# Patient Record
Sex: Male | Born: 1990 | Race: White | Hispanic: No | Marital: Single | Smoking: Current every day smoker
Health system: Southern US, Community
[De-identification: ages and names within clinical notes are randomized; demographics above are authoritative.]

## PROBLEM LIST (undated history)

## (undated) DIAGNOSIS — F32A Depression, unspecified: Secondary | ICD-10-CM

## (undated) DIAGNOSIS — F419 Anxiety disorder, unspecified: Secondary | ICD-10-CM

## (undated) DIAGNOSIS — F329 Major depressive disorder, single episode, unspecified: Secondary | ICD-10-CM

---

## 2017-08-24 DIAGNOSIS — N179 Acute kidney failure, unspecified: Secondary | ICD-10-CM | POA: Diagnosis not present

## 2017-08-24 DIAGNOSIS — E669 Obesity, unspecified: Secondary | ICD-10-CM

## 2017-08-24 DIAGNOSIS — T50902A Poisoning by unspecified drugs, medicaments and biological substances, intentional self-harm, initial encounter: Secondary | ICD-10-CM | POA: Diagnosis not present

## 2017-08-24 DIAGNOSIS — D72829 Elevated white blood cell count, unspecified: Secondary | ICD-10-CM

## 2017-08-25 ENCOUNTER — Other Ambulatory Visit: Payer: Self-pay

## 2017-08-25 ENCOUNTER — Encounter (HOSPITAL_COMMUNITY): Payer: Self-pay | Admitting: *Deleted

## 2017-08-25 ENCOUNTER — Inpatient Hospital Stay (HOSPITAL_COMMUNITY)
Admission: AD | Admit: 2017-08-25 | Discharge: 2017-08-30 | DRG: 885 | Disposition: A | Discharge status: Home / Self Care | Payer: BLUE CROSS/BLUE SHIELD | Source: Other Acute Inpatient Hospital | Attending: Psychiatry | Admitting: Psychiatry

## 2017-08-25 DIAGNOSIS — F909 Attention-deficit hyperactivity disorder, unspecified type: Secondary | ICD-10-CM | POA: Diagnosis present

## 2017-08-25 DIAGNOSIS — F17290 Nicotine dependence, other tobacco product, uncomplicated: Secondary | ICD-10-CM | POA: Diagnosis present

## 2017-08-25 DIAGNOSIS — R450 Nervousness: Secondary | ICD-10-CM | POA: Diagnosis not present

## 2017-08-25 DIAGNOSIS — Z880 Allergy status to penicillin: Secondary | ICD-10-CM

## 2017-08-25 DIAGNOSIS — N179 Acute kidney failure, unspecified: Secondary | ICD-10-CM | POA: Diagnosis not present

## 2017-08-25 DIAGNOSIS — F332 Major depressive disorder, recurrent severe without psychotic features: Secondary | ICD-10-CM | POA: Diagnosis present

## 2017-08-25 DIAGNOSIS — R45851 Suicidal ideations: Secondary | ICD-10-CM | POA: Diagnosis not present

## 2017-08-25 DIAGNOSIS — D72829 Elevated white blood cell count, unspecified: Secondary | ICD-10-CM | POA: Diagnosis not present

## 2017-08-25 DIAGNOSIS — F17210 Nicotine dependence, cigarettes, uncomplicated: Secondary | ICD-10-CM | POA: Diagnosis not present

## 2017-08-25 DIAGNOSIS — E669 Obesity, unspecified: Secondary | ICD-10-CM | POA: Diagnosis not present

## 2017-08-25 DIAGNOSIS — F419 Anxiety disorder, unspecified: Secondary | ICD-10-CM | POA: Diagnosis present

## 2017-08-25 DIAGNOSIS — F603 Borderline personality disorder: Secondary | ICD-10-CM | POA: Diagnosis present

## 2017-08-25 DIAGNOSIS — Z79899 Other long term (current) drug therapy: Secondary | ICD-10-CM | POA: Diagnosis not present

## 2017-08-25 DIAGNOSIS — F322 Major depressive disorder, single episode, severe without psychotic features: Secondary | ICD-10-CM | POA: Diagnosis not present

## 2017-08-25 DIAGNOSIS — F1010 Alcohol abuse, uncomplicated: Secondary | ICD-10-CM | POA: Diagnosis present

## 2017-08-25 DIAGNOSIS — T43222A Poisoning by selective serotonin reuptake inhibitors, intentional self-harm, initial encounter: Secondary | ICD-10-CM | POA: Diagnosis present

## 2017-08-25 DIAGNOSIS — Z915 Personal history of self-harm: Secondary | ICD-10-CM | POA: Diagnosis not present

## 2017-08-25 DIAGNOSIS — T39312A Poisoning by propionic acid derivatives, intentional self-harm, initial encounter: Secondary | ICD-10-CM | POA: Diagnosis present

## 2017-08-25 DIAGNOSIS — G479 Sleep disorder, unspecified: Secondary | ICD-10-CM | POA: Diagnosis present

## 2017-08-25 DIAGNOSIS — T50902A Poisoning by unspecified drugs, medicaments and biological substances, intentional self-harm, initial encounter: Secondary | ICD-10-CM | POA: Diagnosis not present

## 2017-08-25 DIAGNOSIS — F14929 Cocaine use, unspecified with intoxication, unspecified: Secondary | ICD-10-CM | POA: Diagnosis present

## 2017-08-25 DIAGNOSIS — Z91018 Allergy to other foods: Secondary | ICD-10-CM | POA: Diagnosis not present

## 2017-08-25 HISTORY — DX: Depression, unspecified: F32.A

## 2017-08-25 HISTORY — DX: Major depressive disorder, single episode, unspecified: F32.9

## 2017-08-25 HISTORY — DX: Anxiety disorder, unspecified: F41.9

## 2017-08-25 MED ORDER — HYDROXYZINE HCL 25 MG PO TABS
25.0000 mg | ORAL_TABLET | Freq: Three times a day (TID) | ORAL | Status: DC | PRN
  Administered 2017-08-26 – 2017-08-27 (×2): 25 mg via ORAL
  Filled 2017-08-25 (×2): qty 1

## 2017-08-25 MED ORDER — NICOTINE 14 MG/24HR TD PT24
14.0000 mg | MEDICATED_PATCH | Freq: Every day | TRANSDERMAL | Status: DC
  Administered 2017-08-26 – 2017-08-30 (×5): 14 mg via TRANSDERMAL
  Filled 2017-08-25 (×7): qty 1

## 2017-08-25 MED ORDER — TRAZODONE HCL 50 MG PO TABS
50.0000 mg | ORAL_TABLET | Freq: Every evening | ORAL | Status: DC | PRN
  Administered 2017-08-25: 50 mg via ORAL
  Filled 2017-08-25 (×6): qty 1

## 2017-08-25 NOTE — BH Assessment (Signed)
Assessment Note  Terrence Harris is an 27 y.o. male.  Per [facility] Sal[facility] assessment: "Pt overdosed on Ibuprofen XL 300mg  10 tabs and Lexapro 20mg  8 tabs. Pt admits to an intentional overdose. Pt states he wanted to feel numb. Pt currently denies SI/HI and AVH. Pt denies previous SI attempt. Pt denies current mental health history. Pt states he is prescribed Gabbapenten, Lexapro, and Wellbutrin. Pt states his PCP prescribed his psychiatric medication. Pt states that the last time he had mental health treatment was in November 2018. Pt states he was being treated for Borderline Personality Disorder, ADHD, depression, and anxiety. Pt states he is depressed due to recently going to through a divorce and his children are in DSS custody. Pt states he previously hospitalized at [facility] in 2018. Pt currently on probation for assault."  Diagnosis:  F33.2 MDD; F14.20 cocaine use  Past Medical History: No past medical history on file.   Family History: No family history on file.  Social History:  has no tobacco, alcohol, and drug history on file.  Additional Social History:  Alcohol / Drug Use Pain Medications: please see mar Prescriptions: please see mar Over the Counter: please see mar History of alcohol / drug use?: Yes Longest period of sobriety (when/how long): unknown Substance #1 Name of Substance 1: cocaine 1 - Age of First Use: unknown 1 - Amount (size/oz): unknown 1 - Frequency: unknown 1 - Duration: unknown 1 - Last Use / Amount: unknown  CIWA:   COWS:    Allergies: Allergies not on file  Home Medications:  No medications prior to admission.    OB/GYN Status:  No LMP for male patient.  General Assessment Data [facility] TTS Assessment: Out of system Is this a Tele or Face-to-Face Assessment?: Tele Assessment Is this an Initial Assessment or a Re-assessment for this encounter?: Initial Assessment Marital status: Divorced [place] name:  NA Is patient pregnant?: No Pregnancy Status: No Living Arrangements: Other relatives Can pt return to current living arrangement?: Yes Admission Status: Voluntary Is patient capable of signing voluntary admission?: Yes Referral Source: Self/Family/Friend Insurance type: BCBS     Crisis Care Plan Living Arrangements: Other relatives Legal Guardian: Other:(self) Name of Psychiatrist: NA Name of Therapist: NA  Education Status Is patient currently in school?: No Is the patient employed, unemployed or receiving disability?: Employed  Risk to self with the past 6 months Suicidal Ideation: Yes-Currently Present Has patient been a risk to self within the past 6 months prior to admission? : No Suicidal Intent: Yes-Currently Present Has patient had any suicidal intent within the past 6 months prior to admission? : No Is patient at risk for suicide?: Yes Suicidal Plan?: Yes-Currently Present Has patient had any suicidal plan within the past 6 months prior to admission? : No Specify Current Suicidal Plan: to overdose Access to Means: Yes Specify Access to Suicidal Means: access to pills What has been your use of drugs/alcohol within the last 12 months?: cocaine Previous Attempts/Gestures: No How many times?: 0 Other Self Harm Risks: NA Triggers for Past Attempts: None known Intentional Self Injurious Behavior: None Family Suicide History: No Recent stressful life event(s): Conflict (Comment), Divorce Persecutory voices/beliefs?: No Depression: Yes Depression Symptoms: Insomnia, Isolating, Tearfulness, Loss of interest in usual pleasures, Feeling worthless/self pity, Feeling angry/irritable Substance abuse history and/or treatment for substance abuse?: No Suicide prevention information given to non-admitted patients: Not applicable  Risk to Others within the past 6 months Homicidal Ideation: No Does patient have  any lifetime risk of violence toward others beyond the six months  prior to admission? : No Thoughts of Harm to Others: No Current Homicidal Intent: No Current Homicidal Plan: No Access to Homicidal Means: No Identified Victim: NA History of harm to others?: No Assessment of Violence: None Noted Violent Behavior Description: NA Does patient have access to weapons?: No Criminal Charges Pending?: No Does patient have a court date: Yes Court Date: 08/25/17(DSS custody case) Is patient on probation?: No  Psychosis Hallucinations: None noted Delusions: None noted  Mental Status Report Appearance/Hygiene: Unremarkable Eye Contact: Fair Motor Activity: Freedom of movement Speech: Logical/coherent Level of Consciousness: Alert Mood: Depressed Affect: Depressed Anxiety Level: Minimal Thought Processes: Coherent, Relevant Judgement: Impaired Orientation: Person, Place, Time, Situation, Appropriate for developmental age Obsessive Compulsive Thoughts/Behaviors: None  Cognitive Functioning Concentration: Normal Memory: Recent Intact, Remote Intact Is patient IDD: No Is patient DD?: No Insight: Poor Impulse Control: Poor Appetite: Fair Have you had any weight changes? : No Change Sleep: Decreased Total Hours of Sleep: 5 Vegetative Symptoms: None  ADLScreening [facility]) Patient's cognitive ability adequate to safely complete daily activities?: Yes Patient able to express need for assistance with ADLs?: Yes Independently performs ADLs?: Yes (appropriate for developmental age)  Prior Inpatient Therapy Prior Inpatient Therapy: No  Prior Outpatient Therapy Prior Outpatient Therapy: No Does patient have an ACCT team?: No Does patient have Intensive In-House Services?  : No Does patient have [facility] services? : No Does patient have P4CC services?: No  ADL Screening (condition at time of admission) Patient's cognitive ability adequate to safely complete daily activities?: Yes Is the patient deaf or have difficulty hearing?:  No Does the patient have difficulty seeing, even when wearing glasses/contacts?: No Does the patient have difficulty concentrating, remembering, or making decisions?: No Patient able to express need for assistance with ADLs?: Yes Does the patient have difficulty dressing or bathing?: No Independently performs ADLs?: Yes (appropriate for developmental age) Does the patient have difficulty walking or climbing stairs?: No       Abuse/Neglect Assessment (Assessment to be complete while patient is alone) Abuse/Neglect Assessment Can Be Completed: Yes Physical Abuse: Denies Verbal Abuse: Denies Sexual Abuse: Denies Exploitation of patient/patient's resources: Denies     Merchant navy officerAdvance Directives (For Healthcare) Does Patient Have a Medical Advance Directive?: No Would patient like information on creating a medical advance directive?: No - Patient declined    Additional Information 1:1 In Past 12 Months?: No CIRT Risk: No Elopement Risk: No Does patient have medical clearance?: Yes     Disposition:  Disposition Initial Assessment Completed for this Encounter: Yes Disposition of Patient: Admit Type of inpatient treatment program: Adult  On Site Evaluation by:   Reviewed with Physician:    Emmit PomfretLevette,Bradey Luzier D 08/25/2017 4:51 PM

## 2017-08-25 NOTE — Progress Notes (Signed)
Terrence Harris is a 27 year old male pt [facility] on [facility] basis [facility] On admission he spoke about how he took too many of his medications but denied it was a suicide attempt and reports he was trying to get some sleep. He reports that his roommate called the police because he was acting weird and was subsequently [facility]. He denies any SI and is able to contract for safety on the unit. He reports having some stressors including his children being in foster care and reports that both he and his ex-wife have issues and believe that he will ultimately lose custody of them. He also reports having difficulties, in general, since he got divorced. He reports that he has a PCP that prescribes his medications and reports that he takes his medications as prescribed. He denies any substance abuse issues however UDS was positive for cocaine. He reports that he lives with a roommate and reports that he will return there once he is discharged. [name] was oriented to the unit and safety maintained.

## 2017-08-25 NOTE — Tx Team (Signed)
Initial Treatment Plan 08/25/2017 7:47 PM Terrence Fishmanichard [place] ZOX:[id number]    PATIENT STRESSORS: Marital or family conflict Substance abuse   PATIENT STRENGTHS: Ability for insight Average or above average intelligence Capable of independent living General fund of knowledge   PATIENT IDENTIFIED PROBLEMS: Depression Anxiety Suicidal thoughts "I have been having a rough time since my divorce" "I'm probably going to lose my kids, they are in foster care right now"                     DISCHARGE CRITERIA:  Ability to meet basic life and health needs Improved stabilization in mood, thinking, and/or behavior Reduction of life-threatening or endangering symptoms to within safe limits Verbal commitment to aftercare and medication compliance  PRELIMINARY DISCHARGE PLAN: Attend aftercare/continuing care group Return to previous living arrangement  PATIENT/FAMILY INVOLVEMENT: This treatment plan has been presented to and reviewed with the patient, Terrence Harris, and/or family member, .  The patient and family have been given the opportunity to ask questions and make suggestions.  Constantina Laseter, Terrence Harris, [place]RN 08/25/2017, 7:47 PM

## 2017-08-26 ENCOUNTER — Encounter (HOSPITAL_COMMUNITY): Payer: Self-pay | Admitting: Behavioral Health

## 2017-08-26 DIAGNOSIS — R45851 Suicidal ideations: Secondary | ICD-10-CM

## 2017-08-26 DIAGNOSIS — R450 Nervousness: Secondary | ICD-10-CM

## 2017-08-26 DIAGNOSIS — F17210 Nicotine dependence, cigarettes, uncomplicated: Secondary | ICD-10-CM

## 2017-08-26 DIAGNOSIS — F332 Major depressive disorder, recurrent severe without psychotic features: Secondary | ICD-10-CM

## 2017-08-26 DIAGNOSIS — F322 Major depressive disorder, single episode, severe without psychotic features: Secondary | ICD-10-CM

## 2017-08-26 LAB — URINALYSIS, COMPLETE (UACMP) WITH MICROSCOPIC
Bacteria, UA: NONE SEEN
Bilirubin Urine: NEGATIVE
Glucose, UA: NEGATIVE mg/dL
Hgb urine dipstick: NEGATIVE
Ketones, ur: NEGATIVE mg/dL
Leukocytes, UA: NEGATIVE
Nitrite: NEGATIVE
Protein, ur: NEGATIVE mg/dL
Specific Gravity, Urine: 1.021 (ref 1.005–1.030)
pH: 5 (ref 5.0–8.0)

## 2017-08-26 LAB — RAPID URINE DRUG SCREEN, HOSP PERFORMED
Amphetamines: NOT DETECTED
Benzodiazepines: NOT DETECTED
Cocaine: NOT DETECTED
Opiates: NOT DETECTED
Tetrahydrocannabinol: NOT DETECTED

## 2017-08-26 MED ORDER — MIRTAZAPINE 7.5 MG PO TABS
7.5000 mg | ORAL_TABLET | Freq: Every day | ORAL | Status: DC
  Administered 2017-08-26 – 2017-08-27 (×2): 7.5 mg via ORAL
  Filled 2017-08-26 (×3): qty 1

## 2017-08-26 MED ORDER — VENLAFAXINE HCL ER 37.5 MG PO CP24
37.5000 mg | ORAL_CAPSULE | Freq: Every day | ORAL | Status: DC
  Administered 2017-08-27 – 2017-08-28 (×2): 37.5 mg via ORAL
  Filled 2017-08-26 (×3): qty 1
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
## 2017-08-26 NOTE — Progress Notes (Signed)
Patient ID: Terrence Harris, male   DOB: 03/19/1990, 27 y.o.   MRN: [id number]  D: Patient denies SI/HI and auditory and visual hallucinations. Patient is sad and depressed. Affect is flat. Brightens somewhat on approach. Attending and participating appropriately in groups.  A: Patient given emotional support from RN. Patient given medications per MD orders. Patient encouraged to attend groups and unit activities. Patient encouraged to come to staff with any questions or concerns.  R: Patient remains cooperative and appropriate. Will continue to monitor patient for safety.

## 2017-08-26 NOTE — Progress Notes (Signed)
Pt reports he has still had a high anxiety level today.  He states the doctor made med changes and he is hopeful that it will be effective to lower his anxiety.  He denies SI/HI/AVH at this time.  He was given Vistaril 25 mg prn at his request for his anxiety.  He was observed to have minimal interaction with other patients, but has been pleasant and appropriate.  Support and encouragement offered.  Discharge plans are in process.  Safety maintained with q15 minute checks.

## 2017-08-26 NOTE — [facility] Group Notes (Signed)
[facility] Group Notes:  (Nursing/MHT/Case Management/Adjunct)  Date:  08/26/2017  Time:  4:00 PM  Type of Therapy:  Psychoeducational Skills  Participation Level:  Did Not Attend  Summary of Progress/Problems: Patient was invited but declined to attend group.  Marchelle Folksmanda A Xzavier Swinger 08/26/2017, 5:00 PM

## 2017-08-26 NOTE — [facility] Suicide Risk Assessment (Signed)
[facility]   Nursing information obtained from:  Patient Demographic factors:  Male, Adolescent or young adult, Divorced or widowed, Caucasian Current Mental Status:  Suicidal ideation indicated by patient, Self-harm behaviors Loss Factors:  Loss of significant relationship Historical Factors:  Prior suicide attempts, Family history of mental illness or substance abuse Risk Reduction Factors:  Responsible for children under 26 years of age, Positive coping skills or problem solving skills  Total Time spent with patient: 45 minutes Principal Problem:  MDD, no psychotic features  Diagnosis:   Patient Active Problem List   Diagnosis Date Noted  . MDD (major depressive disorder), severe (H[facility]) [F32.2] 08/25/2017   Subjective Data:   Continued Clinical Symptoms:  Alcohol Use Disorder Identification Test Final Score (AUDIT): 3 The "Alcohol Use Disorders Identification Test", Guidelines for Use in Primary Care, Second Edition.  World Science writer[employer] [facility](WHO). Score between 0-7:  no or low risk or alcohol related problems. Score between 8-15:  moderate risk of alcohol related problems. Score between 16-19:  high risk of alcohol related problems. Score 20 or above:  warrants further diagnostic evaluation for alcohol dependence and treatment.   CLINICAL FACTORS:  27 year old divorced male, lives with roommate, has two children ( 6,4) who are currently in CPS custody,currently unemployed .  [facility],  following overdose on prescribed Wellbutrin XL ( about 7 tablets ), Lexapro  (about 7 tablets as well). States overdose was impulsive, unplanned and followed a stressful phone conversation with ex wife .States purpose of overdose was not suicidal but " to get some rest ". States that after overdose roommate was concerned and called 911. He does endorse worsening depression, with subjective feelings of hopelessness, neuro-vegetative symptoms  such as decreased appetite, decreased energy level, poor sleep, anhedonia,but denies any suicidal ideations prior to above. Denies psychotic symptoms other than brief hallucinations ( now resolved ) following overdose .  One prior psychiatric admission 2 years ago for alcohol abuse and suicidal ideations. No history of prior suicidal attempts, history of self cutting 2 years ago, denies history of mania. Prior to admission he was being prescribed Wellbutrin XL, Lexapro, Neurontin, Vyvanse .  Denies drug abuse, reports binge drinking, denies history of blackouts , denies history of WDL seizures, does report DUI 3 weeks ago.  Reports last alcohol consumption more than 1-2 weeks ago.   Denies medical illnesses . Allergic to PCN and Red Dye.  Dx- MDD, No Psychotic Features   Plan - Inpatient admission. We discussed options, agrees to new antidepressant medication trial.  Start Effexor XR 37.5 mgrs QDAY, start Remeron 7.5 mgrs QHS.      Musculoskeletal: Strength & Muscle Tone: within normal limits Gait & Station: normal Patient leans: N/A  Psychiatric Specialty Exam: Physical Exam  ROS mild headache, no chest pain, no shortness of breath, no vomiting , no fever.   Blood pressure 130/83, pulse 88, temperature 99.5 F (37.5 C), temperature source Oral, resp. rate 20, height 5\' 10"  (1.778 m), weight 118.8 kg (262 lb).Body mass index is 37.59 kg/m.  General Appearance: Fairly Groomed  Eye Contact:  Good  Speech:  Normal Rate  Volume:  Normal  Mood:  Depressed  Affect:  constricted, vaguely anxious  Thought Process:  Linear and Descriptions of Associations: Intact  Orientation:  Other:  fully alert and attentive   Thought Content:  no hallucinations, no delusions, not internally preoccupied   Suicidal Thoughts:  No denies any current  suicidal or self injurious ideations, contracts for safety on unit   Homicidal Thoughts:  No denies any homicidal or violent ideations  Memory:  recent  and remote grossly intact   Judgement:  Fair  Insight:  Fair  Psychomotor Activity:  slightly restless   Concentration:  Concentration: Good and Attention Span: Good  Recall:  Good  Fund of Knowledge:  Good  Language:  Good  Akathisia:  Negative  Handed:  Right  AIMS (if indicated):     Assets:  Desire for Improvement Resilience  ADL's:  Intact  Cognition:  WNL  Sleep:  Number of Hours: 6.25      COGNITIVE FEATURES THAT CONTRIBUTE TO RISK:  Closed-mindedness and Loss of executive function    SUICIDE RISK:   Moderate:  Frequent suicidal ideation with limited intensity, and duration, some specificity in terms of plans, no associated intent, good self-control, limited dysphoria/symptomatology, some risk factors present, and identifiable protective factors, including available and accessible social support.  PLAN OF CARE: Patient will be admitted to inpatient psychiatric unit for stabilization and safety. Will provide and encourage milieu participation. Provide medication management and maked adjustments as needed.  Will follow daily.    I certify that inpatient services furnished can reasonably be expected to improve the patient's condition.   Craige Cotta, MD 08/26/2017, 2:03 PM

## 2017-08-26 NOTE — Progress Notes (Signed)
Adult Psychoeducational Group Note  Date:  08/26/2017 Time:  9:41 AM  Group Topic/Focus:  Orientation:   The focus of this group is to educate the patient on the purpose and policies of crisis stabilization and provide a format to answer questions about their admission.  The group details unit policies and expectations of patients while admitted.  Participation Level:  Active  Participation Quality:  Appropriate  Affect:  Appropriate  Cognitive:  Alert  Insight: Appropriate  Engagement in Group:  Engaged  Modes of Intervention:  Discussion and Education  Additional Comments:    Pt participated in group. Pt's goal today is to find information regarding outpatient therapy that he can attend after he is discharged.   Karren CobbleFizah G Payslie Mccaig 08/26/2017, 9:41 AM
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
## 2017-08-26 NOTE — Progress Notes (Signed)
[facility] Group Notes:  (Nursing/MHT/Case Management/Adjunct)  Date:  08/26/2017  Time:  2045  Type of Therapy:  wrap up group  Participation Level:  Active  Participation Quality:  Appropriate, Attentive, Sharing and Supportive  Affect:  Anxious and Appropriate  Cognitive:  Appropriate  Insight:  Improving  Engagement in Group:  Engaged  Modes of Intervention:  Clarification, Education and Support  Summary of Progress/Problems:  Sinan Tuch S 08/26/2017, 10:44 PM 

## 2017-08-27 LAB — CBC WITH DIFFERENTIAL/PLATELET
Basophils Absolute: 0 10*3/uL (ref 0.0–0.1)
Basophils Relative: 1 %
EOS ABS: 0.2 10*3/uL (ref 0.0–0.7)
Eosinophils Relative: 3 %
HEMATOCRIT: 44.2 % (ref 39.0–52.0)
HEMOGLOBIN: 14.9 g/dL (ref 13.0–17.0)
LYMPHS ABS: 2.2 10*3/uL (ref 0.7–4.0)
LYMPHS PCT: 28 %
MCH: 28.7 pg (ref 26.0–34.0)
MCHC: 33.7 g/dL (ref 30.0–36.0)
MCV: 85.2 fL (ref 78.0–100.0)
MONOS PCT: 8 %
Monocytes Absolute: 0.6 10*3/uL (ref 0.1–1.0)
NEUTROS ABS: 4.8 10*3/uL (ref 1.7–7.7)
NEUTROS PCT: 60 %
Platelets: 304 10*3/uL (ref 150–400)
RBC: 5.19 MIL/uL (ref 4.22–5.81)
RDW: 14.3 % (ref 11.5–15.5)
WBC: 7.9 10*3/uL (ref 4.0–10.5)

## 2017-08-27 LAB — SALICYLATE LEVEL: Salicylate Lvl: <7 mg/dL (ref 2.8–30.0)

## 2017-08-27 LAB — LIPID PANEL
CHOL/HDL RATIO: 5.1 ratio
Cholesterol: 221 mg/dL — ABNORMAL HIGH (ref 0–200)
HDL: 43 mg/dL (ref >40)
LDL CALC: 132 mg/dL — AB (ref 0–99)
Triglycerides: 228 mg/dL — ABNORMAL HIGH (ref <150)
VLDL: 46 mg/dL — AB (ref 0–40)

## 2017-08-27 LAB — ACETAMINOPHEN LEVEL: Acetaminophen (Tylenol), Serum: <10 ug/mL — ABNORMAL LOW (ref 10–30)

## 2017-08-27 LAB — BASIC METABOLIC PANEL
Anion gap: 7 (ref 5–15)
BUN: 11 mg/dL (ref 6–20)
CHLORIDE: 108 mmol/L (ref 98–111)
CO2: 26 mmol/L (ref 22–32)
CREATININE: 0.99 mg/dL (ref 0.61–1.24)
Calcium: 8.9 mg/dL (ref 8.9–10.3)
GFR calc Af Amer: >60 mL/min (ref >60)
GFR calc non Af Amer: >60 mL/min (ref >60)
Glucose, Bld: 99 mg/dL (ref 70–99)
POTASSIUM: 4 mmol/L (ref 3.5–5.1)
Sodium: 141 mmol/L (ref 135–145)

## 2017-08-27 LAB — HEMOGLOBIN A1C
HEMOGLOBIN A1C: 5.1 % (ref 4.8–5.6)
Mean Plasma Glucose: 99.67 mg/dL

## 2017-08-27 LAB — TSH: TSH: 1.231 u[IU]/mL (ref 0.350–4.500)

## 2017-08-27 NOTE — Plan of Care (Signed)
Problem: Safety: Goal: Periods of time without injury will increase Intervention: Patient contracts for safety on the unit. Low fall risk precautions in place. Safety monitored with q15 minute checks. Outcome: Patient remains safe on the unit at this time. 08/27/2017 1:59 PM - Progressing by Ferrel Loganollazo, Zaveon Gillen A, RN

## 2017-08-27 NOTE — Progress Notes (Signed)
Recreation Therapy Notes  Date: 6.28.19 Time: 0930 Location: [address] Dayroom  Group Topic: Stress Management  Goal Area(s) Addresses:  Patient will verbalize importance of using healthy stress management.  Patient will identify positive emotions associated with healthy stress management.   Intervention: Stress Management  Activity :  [employer] Meditation.  LRT introduced the stress management technique of meditation.  LRT read a script on how mountains stay the same in spite of the things that go on around them.  Education:  Stress Management, Discharge Planning.   Education Outcome: Acknowledges edcuation/In group clarification offered/Needs additional education  Clinical Observations/Feedback: Pt did not attend group.    Caroll RancherMarjette Nimco Bivens, LRT/CTRS         Caroll RancherLindsay, Peg Fifer A 08/27/2017 12:16 PM

## 2017-08-27 NOTE — [facility] Group Notes (Signed)
Adult Psychoeducational Group Note  Date:  08/27/2017  Time: 4:00 PM  Group Topic/Focus: Barriers to Mental Health Care  Participation Level:  Active  Participation Quality:  Appropriate and Attentive  Affect:  Appropriate  Cognitive:  Alert and Oriented  Insight: Improving  Engagement in Group:  Developing/Improving  Modes of Intervention:  Discussion and Education  Additional Comments:  Patient discussed how limited resources in his area caused him to "give up" on seeking help for his mental health. Patient reports our social workers have been helping him with plans for discharge. Patient improving insight and was supportive of peers.  Marchelle Folksmanda A Sonyia Muro 08/27/2017 5:00 PM

## 2017-08-27 NOTE — Tx Team (Signed)
Interdisciplinary Treatment and Diagnostic Plan Update  08/27/2017 Time of Session: 9:30a Terrence FishmanRichard Goffe MRN: [id number]  Principal Diagnosis: <principal problem not specified>  Secondary Diagnoses: Active Problems:   MDD (major depressive disorder), severe (HCC)   MDD (major depressive disorder), recurrent severe, without psychosis (HCC)   Current Medications:  Current Facility-Administered Medications  Medication Dose Route Frequency Provider Last Rate Last Dose  . hydrOXYzine (ATARAX/VISTARIL) tablet 25 mg  25 mg Oral TID PRN Jackelyn PolingBerry, Jason A, NP   25 mg at 08/26/17 2209  . mirtazapine (REMERON) tablet 7.5 mg  7.5 mg Oral QHS Cobos, Rockey SituFernando A, MD   7.5 mg at 08/26/17 2209  . nicotine (NICODERM CQ - dosed in mg/24 hours) patch 14 mg  14 mg Transdermal Daily Cobos, Rockey SituFernando A, MD   14 mg at 08/27/17 1116  . venlafaxine XR (EFFEXOR-XR) 24 hr capsule 37.5 mg  37.5 mg Oral Q breakfast Cobos, Rockey SituFernando A, MD   37.5 mg at 08/27/17 1115   PTA Medications: Medications Prior to Admission  Medication Sig Dispense Refill Last Dose  . buPROPion (WELLBUTRIN XL) 300 MG 24 hr tablet Take 300 mg by mouth daily.  0   . escitalopram (LEXAPRO) 20 MG tablet Take 20 mg by mouth daily.  0   . gabapentin (NEURONTIN) 300 MG capsule Take 300 mg by mouth 3 (three) times daily.  0   . PROAIR HFA 108 (90 Base) MCG/ACT inhaler Inhale 2 puffs into the lungs every 4 (four) hours as needed for shortness of breath or wheezing.  0   . traZODone (DESYREL) 100 MG tablet Take 100 mg by mouth at bedtime as needed for sleep.  0   . VYVANSE 40 MG capsule Take 40 mg by mouth daily.  0     Patient Stressors: Marital or family conflict Substance abuse  Patient Strengths: Ability for insight Average or above average intelligence Capable of independent living General fund of knowledge  Treatment Modalities: Medication Management, Group therapy, Case management,  1 to 1 session with clinician, Psychoeducation,  Recreational therapy.   Physician Treatment Plan for Primary Diagnosis: <principal problem not specified> Long Term Goal(s): Improvement in symptoms so as ready for discharge Improvement in symptoms so as ready for discharge   Short Term Goals: Ability to disclose and discuss suicidal ideas Ability to identify and develop effective coping behaviors will improve Compliance with prescribed medications will improve Ability to identify triggers associated with substance abuse/mental health issues will improve Ability to identify changes in lifestyle to reduce recurrence of condition will improve Ability to disclose and discuss suicidal ideas Ability to demonstrate self-control will improve Ability to identify and develop effective coping behaviors will improve  Medication Management: Evaluate patient's response, side effects, and tolerance of medication regimen.  Therapeutic Interventions: 1 to 1 sessions, Unit Group sessions and Medication administration.  Evaluation of Outcomes: Progressing  Physician Treatment Plan for Secondary Diagnosis: Active Problems:   MDD (major depressive disorder), severe (HCC)   MDD (major depressive disorder), recurrent severe, without psychosis (HCC)  Long Term Goal(s): Improvement in symptoms so as ready for discharge Improvement in symptoms so as ready for discharge   Short Term Goals: Ability to disclose and discuss suicidal ideas Ability to identify and develop effective coping behaviors will improve Compliance with prescribed medications will improve Ability to identify triggers associated with substance abuse/mental health issues will improve Ability to identify changes in lifestyle to reduce recurrence of condition will improve Ability to disclose and discuss suicidal ideas Ability  to demonstrate self-control will improve Ability to identify and develop effective coping behaviors will improve     Medication Management: Evaluate patient's  response, side effects, and tolerance of medication regimen.  Therapeutic Interventions: 1 to 1 sessions, Unit Group sessions and Medication administration.  Evaluation of Outcomes: Progressing   RN Treatment Plan for Primary Diagnosis: <principal problem not specified> Long Term Goal(s): Knowledge of disease and therapeutic regimen to maintain health will improve  Short Term Goals: Ability to disclose and discuss suicidal ideas, Ability to identify and develop effective coping behaviors will improve and Compliance with prescribed medications will improve  Medication Management: RN will administer medications as ordered by provider, will assess and evaluate patient's response and provide education to patient for prescribed medication. RN will report any adverse and/or side effects to prescribing provider.  Therapeutic Interventions: 1 on 1 counseling sessions, Psychoeducation, Medication administration, Evaluate responses to treatment, Monitor vital signs and CBGs as ordered, Perform/monitor CIWA, COWS, AIMS and Fall Risk screenings as ordered, Perform wound care treatments as ordered.  Evaluation of Outcomes: Progressing   LCSW Treatment Plan for Primary Diagnosis: <principal problem not specified> Long Term Goal(s): Safe transition to appropriate next level of care at discharge, Engage patient in therapeutic group addressing interpersonal concerns.  Short Term Goals: Engage patient in aftercare planning with referrals and resources  Therapeutic Interventions: Assess for all discharge needs, 1 to 1 time with Social worker, Explore available resources and support systems, Assess for adequacy in community support network, Educate family and significant other(s) on suicide prevention, Complete Psychosocial Assessment, Interpersonal group therapy.  Evaluation of Outcomes: Progressing   Progress in Treatment: Attending groups: No. Participating in groups: No. Taking medication as  prescribed: Yes. Toleration medication: Yes. Family/Significant other contact made: No, will contact:  the patient's mother Patient understands diagnosis: Yes. Discussing patient identified problems/goals with staff: Yes. Medical problems stabilized or resolved: Yes. Denies suicidal/homicidal ideation: Yes. Issues/concerns per patient self-inventory: No. Other:  New problem(s) identified: None   New Short Term/Long Term Goal(s):medication stabilization, elimination of SI thoughts, development of comprehensive mental wellness plan.    Patient Goals: :" I have been having a rough time since my divorce and I'm probably going to lose my kids, they are in foster care right now"  Discharge Plan or Barriers: CSW will assess for appropriate referrals.   Reason for Continuation of Hospitalization: Depression Medication stabilization Suicidal ideation  Estimated Length of Stay: 3-5 days   Attendees: Patient: Terrence Harris 08/27/2017 4:04 PM  Physician: Dr. Nehemiah Massed, MD 08/27/2017 4:04 PM  Nursing: Marchelle Folks.Salena Saner, RN 08/27/2017 4:04 PM  RN Care Manager:X 08/27/2017 4:04 PM  Social Worker: Baldo Daub, LCSWA 08/27/2017 4:04 PM  Recreational Therapist: X 08/27/2017 4:04 PM  Other: X 08/27/2017 4:04 PM  Other: X 08/27/2017 4:04 PM  Other:X 08/27/2017 4:04 PM    Scribe for Treatment Team: Maeola Sarah, LCSWA 08/27/2017 4:04 PM

## 2017-08-27 NOTE — Progress Notes (Signed)
Patient ID: Terrence Harris, male   DOB: 13-Sep-1990, 27 y.o.   MRN: [id number]  Nursing Progress Note [id number]  Data: Patient presents with flat affect and depressed mood. Patient did go to breakfast this morning but did not get up for his 0800 medications when prompted by RN. Patient reports feeling "tired, low energy" and was observed sleeping in bed. Patient provided medications when he got up, see MAR. Patient denies pain/physical complaints. Patient completed self-inventory sheet and rates depression, hopelessness, and anxiety 1,1,1 respectively. Patient rates their sleep and appetite as good/good respectively. Patient states goal for today is to "learn to let go of the things outside of my control". Patient currently denies SI/HI/AVH. Patient is isolative to his room, withdrawn, and forwards little when prompted by Clinical research associatewriter.  Action: Patient educated about and provided medication per provider's orders. Patient safety maintained with q15 min safety checks and frequent rounding. Low fall risk precautions in place. Emotional support given. 1:1 interaction and active listening provided. Patient encouraged to attend meals and groups. Patient encouraged to work on treatment plan and goals. Labs, vital signs and patient behavior monitored throughout shift.   Response: Patient agrees to come to staff if any thoughts of SI/HI develop or if patient develops intention of acting on thoughts. Patient remains safe on the unit at this time. Patient is interacting with peers appropriately on the unit. Will continue to support and monitor.

## 2017-08-27 NOTE — Progress Notes (Addendum)
[facility] MD Progress Note  08/27/2017 12:26 PM Terrence Harris  MRN:  [id number] Subjective: Im kind of tired I guess.   Objective: Patient was admitted to the unit after being transferred from  [facility] ED 3 days ago status post overdose  on prescribed Wellbutrin XL ( about 7 tablets ), Lexapro (about 7 tablets as well).  Patient denies that this was an SA and reports that he became overwhelmed and acted off poor impulses. Upon intial assessment patient is observed laying in bed at noon, sleeping.  Patient is encouraged to attend groups and become more active in a therapeutic milieu.  Upon chart review patient did not attend groups this morning and went to 1 group yesterday.  Patient is advised that group participation is a vital part of inpatient treatment, and would suggest he began to participate.  He verbalizes understanding and got out of the bed and prepare himself for group.  When asked the patient about sleep history he states sleeping well the previous night and throughout the day, and was unsure as to why he was sleeping at noon time.  Patient denies withdrawal symptoms, cravings, and urges.  Patient does not seem forthcoming or vested in treatment at this time however with multiple prompts he is able to establish a goal for today.  He states his goal for today is to identify triggers for depression, and review his reasons for overdose.  He was started on mirtazapine 7.5 mg p.o. nightly, and Effexor 37.5 mg p.o. daily with breakfast.  He appears to be tolerating these medications well at this time and denies any adverse effects.  He denies any suicidal ideations, homicidal ideations, and or hallucinations.  He is able to contract for safety while on the unit.   Principal Problem: <principal problem not specified> Diagnosis:   Patient Active Problem List   Diagnosis Date Noted  . MDD (major depressive disorder), recurrent severe, without psychosis ([place]) [F33.2] 08/26/2017  . MDD (major depressive  disorder), severe ([place]) [F32.2] 08/25/2017   Total Time spent with patient: 20 minutes  Past Psychiatric History: Depression, questionable Borderline Personality disorder, Alcohol abuse. one prior psychiatric admission 2 years ago at [facility] for alcohol abuse and suicidal ideations. Prior to admission he was being prescribed Wellbutrin XL, Lexapro, Neurontin, Vyvanse.  Past Medical History:  Past Medical History:  Diagnosis Date  . Anxiety   . Depression    History reviewed. No pertinent surgical history. Family History: History reviewed. No pertinent family history. Family Psychiatric  History: Maternal side mental health issues and alcoholism    Social History:  Social History   Substance and Sexual Activity  Alcohol Use Yes     Social History   Substance and Sexual Activity  Drug Use Yes  . Types: Cocaine    Social History   Socioeconomic History  . Marital status: Single    Spouse name: Not on file  . Number of children: Not on file  . Years of education: Not on file  . Highest education level: Not on file  Occupational History  . Not on file  Social Needs  . Financial resource strain: Not on file  . Food insecurity:    Worry: Not on file    Inability: Not on file  . Transportation needs:    Medical: Not on file    Non-medical: Not on file  Tobacco Use  . Smoking status: Current Every Day Smoker    Types: E-cigarettes  . Smokeless tobacco: Never Used  Substance and Sexual Activity  . Alcohol use: Yes  . Drug use: Yes    Types: Cocaine  . Sexual activity: Not Currently  Lifestyle  . Physical activity:    Days per week: Not on file    Minutes per session: Not on file  . Stress: Not on file  Relationships  . Social connections:    Talks on phone: Not on file    Gets together: Not on file    Attends religious service: Not on file    Active member of club or organization: Not on file    Attends meetings of clubs or organizations: Not on file     Relationship status: Not on file  Other Topics Concern  . Not on file  Social History Narrative  . Not on file   Additional Social History:    Pain Medications: please see mar Prescriptions: please see mar Over the Counter: please see mar History of alcohol / drug use?: Yes Longest period of sobriety (when/how long): unknown Name of Substance 1: cocaine 1 - Age of First Use: unknown 1 - Amount (size/oz): unknown 1 - Frequency: unknown 1 - Duration: unknown 1 - Last Use / Amount: unknown   Sleep: Fair  Appetite:  Fair  Current Medications: Current Facility-Administered Medications  Medication Dose Route Frequency Provider Last Rate Last Dose  . hydrOXYzine (ATARAX/VISTARIL) tablet 25 mg  25 mg Oral TID PRN Rozetta Nunnery, NP   25 mg at 08/26/17 2209  . mirtazapine (REMERON) tablet 7.5 mg  7.5 mg Oral QHS Cobos, Myer Peer, MD   7.5 mg at 08/26/17 2209  . nicotine (NICODERM CQ - dosed in mg/24 hours) patch 14 mg  14 mg Transdermal Daily Cobos, Myer Peer, MD   14 mg at 08/27/17 1116  . venlafaxine XR (EFFEXOR-XR) 24 hr capsule 37.5 mg  37.5 mg Oral Q breakfast Cobos, Myer Peer, MD   37.5 mg at 08/27/17 1115    Lab Results:  Results for orders placed or performed during the hospital encounter of 08/25/17 (from the past 48 hour(s))  Urinalysis, Complete w Microscopic     Status: None   Collection Time: 08/26/17  5:25 PM  Result Value Ref Range   Color, Urine YELLOW YELLOW   APPearance CLEAR CLEAR   Specific Gravity, Urine 1.021 1.005 - 1.030   pH 5.0 5.0 - 8.0   Glucose, UA NEGATIVE NEGATIVE mg/dL   Hgb urine dipstick NEGATIVE NEGATIVE   Bilirubin Urine NEGATIVE NEGATIVE   Ketones, ur NEGATIVE NEGATIVE mg/dL   Protein, ur NEGATIVE NEGATIVE mg/dL   Nitrite NEGATIVE NEGATIVE   Leukocytes, UA NEGATIVE NEGATIVE   WBC, UA 0-5 0 - 5 WBC/hpf   Bacteria, UA NONE SEEN NONE SEEN   Squamous Epithelial / LPF 0-5 0 - 5   Mucus PRESENT     Comment: Performed at [facility], [place] [address], [place], [place] [address]  Urine rapid drug screen (hosp performed)     Status: Abnormal   Collection Time: 08/26/17  5:25 PM  Result Value Ref Range   Opiates NONE DETECTED NONE DETECTED   Cocaine NONE DETECTED NONE DETECTED   Benzodiazepines NONE DETECTED NONE DETECTED   Amphetamines NONE DETECTED NONE DETECTED   Tetrahydrocannabinol NONE DETECTED NONE DETECTED   Barbiturates (A) NONE DETECTED    Result not available. Reagent lot number recalled by manufacturer.    Comment: Performed at [facility], [place] [address], [place], [place] [address]  Basic metabolic panel     Status: None   Collection Time: 08/27/17  6:35 AM  Result Value Ref Range   Sodium 141 135 - 145 mmol/L   Potassium 4.0 3.5 - 5.1 mmol/L   Chloride 108 98 - 111 mmol/L    Comment: Please note change in reference range.   CO2 26 22 - 32 mmol/L   Glucose, Bld 99 70 - 99 mg/dL    Comment: Please note change in reference range.   BUN 11 6 - 20 mg/dL    Comment: Please note change in reference range.   Creatinine, Ser 0.99 0.61 - 1.24 mg/dL   Calcium 8.9 8.9 - 10.3 mg/dL   GFR calc non Af Amer >60 >60 mL/min   GFR calc Af Amer >60 >60 mL/min    Comment: (NOTE) The eGFR has been calculated using the CKD EPI equation. This calculation has not been validated in all clinical situations. eGFR's persistently <60 mL/min signify possible Chronic Kidney Disease.    Anion gap 7 5 - 15    Comment: Performed at [facility], [place] [address], [place], [place] [address]  CBC with Differential/Platelet     Status: None   Collection Time: 08/27/17  6:35 AM  Result Value Ref Range   WBC 7.9 4.0 - 10.5 K/uL   RBC 5.19 4.22 - 5.81 MIL/uL   Hemoglobin 14.9 13.0 - 17.0 g/dL   HCT 44.2 39.0 - 52.0 %   MCV 85.2 78.0 - 100.0 fL   MCH 28.7 26.0 - 34.0 pg   MCHC 33.7 30.0 - 36.0 g/dL   RDW 14.3 11.5 - 15.5 %   Platelets 304 150 - 400 K/uL   Neutrophils  Relative % 60 %   Neutro Abs 4.8 1.7 - 7.7 K/uL   Lymphocytes Relative 28 %   Lymphs Abs 2.2 0.7 - 4.0 K/uL   Monocytes Relative 8 %   Monocytes Absolute 0.6 0.1 - 1.0 K/uL   Eosinophils Relative 3 %   Eosinophils Absolute 0.2 0.0 - 0.7 K/uL   Basophils Relative 1 %   Basophils Absolute 0.0 0.0 - 0.1 K/uL    Comment: Performed at [facility], [place] [address], [place], [place] [address]  TSH     Status: None   Collection Time: 08/27/17  6:35 AM  Result Value Ref Range   TSH 1.231 0.350 - 4.500 uIU/mL    Comment: Performed by a 3rd Generation assay with a functional sensitivity of <=0.01 uIU/mL. Performed at [facility], [place] [address] [place], [place] [address]   Lipid panel     Status: Abnormal   Collection Time: 08/27/17  6:35 AM  Result Value Ref Range   Cholesterol 221 (H) 0 - 200 mg/dL   Triglycerides 228 (H) <150 mg/dL   HDL 43 >40 mg/dL   Total CHOL/HDL Ratio 5.1 RATIO   VLDL 46 (H) 0 - 40 mg/dL   LDL Cholesterol 132 (H) 0 - 99 mg/dL    Comment:        Total Cholesterol/HDL:CHD Risk Coronary Heart Disease Risk Table                     Men   Women  1/2 Average Risk   3.4   3.3  Average Risk       5.0   4.4  2 X Average Risk   9.6   7.1  3 X Average Risk  23.4  11.0        Use the calculated Patient Ratio above and the CHD Risk Table to determine the patient's CHD Risk.        ATP III CLASSIFICATION (LDL):  <100     mg/dL   Optimal  100-129  mg/dL   Near or Above                    Optimal  130-159  mg/dL   Borderline  160-189  mg/dL   High  >190     mg/dL   Very High Performed at [place] [address] [place], [place] [address]   Hemoglobin A1c     Status: None   Collection Time: 08/27/17  6:35 AM  Result Value Ref Range   Hgb A1c MFr Bld 5.1 4.8 - 5.6 %    Comment: (NOTE) Pre diabetes:          5.7%-6.4% Diabetes:              >6.4% Glycemic control for   <7.0% adults with diabetes     Mean Plasma Glucose 99.67 mg/dL    Comment: Performed at [place] [address], [place], [place] [address]  Salicylate level     Status: None   Collection Time: 08/27/17  6:35 AM  Result Value Ref Range   Salicylate Lvl <7.4 2.8 - 30.0 mg/dL    Comment: Performed at [facility], [place] [address] [place], [place] [address]  Acetaminophen level     Status: Abnormal   Collection Time: 08/27/17  6:35 AM  Result Value Ref Range   Acetaminophen (Tylenol), Serum <10 (L) 10 - 30 ug/mL    Comment: (NOTE) Therapeutic concentrations vary significantly. A range of 10-30 ug/mL  may be an effective concentration for many patients. However, some  are best treated at concentrations outside of this range. Acetaminophen concentrations >150 ug/mL at 4 hours after ingestion  and >50 ug/mL at 12 hours after ingestion are often associated with  toxic reactions. Performed at [facility], [place] [address], [place], [place] [address]     Blood Alcohol level:  No results found for: [facility]  Metabolic Disorder Labs: Lab Results  Component Value Date   HGBA1C 5.1 08/27/2017   MPG 99.67 08/27/2017   No results found for: PROLACTIN Lab Results  Component Value Date   CHOL 221 (H) 08/27/2017   TRIG 228 (H) 08/27/2017   HDL 43 08/27/2017   CHOLHDL 5.1 08/27/2017   VLDL 46 (H) 08/27/2017   LDLCALC 132 (H) 08/27/2017    Physical Findings: AIMS: Facial and Oral Movements Muscles of Facial Expression: None, normal Lips and Perioral Area: None, normal Jaw: None, normal Tongue: None, normal,Extremity Movements Upper (arms, wrists, hands, fingers): None, normal Lower (legs, knees, ankles, toes): None, normal, Trunk Movements Neck, shoulders, hips: None, normal, Overall Severity Severity of abnormal movements (highest score from questions above): None, normal Incapacitation due to abnormal movements: None, normal Patient's awareness of abnormal movements  (rate only patient's report): No Awareness, Dental Status Current problems with teeth and/or dentures?: No Does patient usually wear dentures?: No   Musculoskeletal: Strength & Muscle Tone: within normal limits Gait & Station: normal Patient leans: N/A  Psychiatric Specialty Exam: Physical Exam  ROS  Blood pressure 104/76, pulse 83, temperature 99.5 F (37.5 C), temperature source Oral, resp. rate 20, height '5\' 10"'  (1.778 m), weight 118.8 kg (262 lb).Body mass index is  37.59 kg/m.  General Appearance: Disheveled and obese, paper scrubs and facial peircings.   Eye Contact:  Minimal  Speech:  Clear and Coherent and Normal Rate  Volume:  Normal  Mood:  Depressed  Affect:  Constricted and Depressed  Thought Process:  Linear and Descriptions of Associations: Intact  Orientation:  Full (Time, Place, and Person)  Thought Content:  Logical  Suicidal Thoughts:  No  Homicidal Thoughts:  No  Memory:  Immediate;   Fair Recent;   Fair  Judgement:  Poor  Insight:  Shallow  Psychomotor Activity:  Psychomotor Retardation  Concentration:  Concentration: Fair and Attention Span: Fair  Recall:  Poor  Fund of Knowledge:  Fair  Language:  Fair  Akathisia:  No  Handed:  Right  AIMS (if indicated):     Assets:  Communication Skills Desire for Improvement Financial Resources/Insurance Leisure Time Physical Health Transportation Vocational/Educational  ADL's:  Intact  Cognition:  WNL  Sleep:  Number of Hours: 5.75     Treatment Plan Summary: Daily contact with patient to assess and evaluate symptoms and progress in treatment and Medication management 1. Admit for crisis management and stabilization, estimated length of stay 3-5 days.  2. Medication management to reduce current symptoms to base line and improve the patient's overall level of functioning: We discussed options, agrees to new antidepressant medication trial.  Start Effexor XR 37.5 mgrs QDAY, start Remeron 7.5 mgrs QHS,  Vistaril 25 mg po Q6hrs as needed for anxiety, Nicoderm TD patch 14 mg daily for smoking cessation. 3. Treat health problems as indicated.  4. Develop treatment plan to decrease risk of relapse upon discharge and the need for readmission.  5. Psycho-social education regarding relapse prevention and self care.  6. Health care follow up as needed for medical problems.  7. Review, reconcile, and reinstate any pertinent home medications for other health issues where appropriate. 8. Call for consults with hospitalist for any additional specialty patient care services as needed. 9. Labs: TSH, CBC with diff, BMP and UA active. Ordered TSH, UDS, ethanol, salycialte  acetaminophen and HgbA1c. Li[pid panel elevated with lDL 132 and total cholesterol greater than 200.     Nanci Pina, FNP 08/27/2017, 12:26 PM   ..Agree with NP Progress Note

## 2017-08-28 MED ORDER — VENLAFAXINE HCL ER 75 MG PO CP24
75.0000 mg | ORAL_CAPSULE | Freq: Every day | ORAL | Status: DC
  Administered 2017-08-29 – 2017-08-30 (×2): 75 mg via ORAL
  Filled 2017-08-28 (×4): qty 1

## 2017-08-28 MED ORDER — MIRTAZAPINE 15 MG PO TABS
15.0000 mg | ORAL_TABLET | Freq: Every day | ORAL | Status: DC
  Filled 2017-08-28: qty 1

## 2017-08-28 MED ORDER — MIRTAZAPINE 15 MG PO TABS
15.0000 mg | ORAL_TABLET | Freq: Every day | ORAL | Status: DC
  Administered 2017-08-28 – 2017-08-29 (×2): 15 mg via ORAL
  Filled 2017-08-28 (×4): qty 1

## 2017-08-28 NOTE — [facility] Group Notes (Signed)
LCSW Group Therapy Note  08/28/2017    10:30-11:30am   Type of Therapy and Topic:  Group Therapy: Anger and Coping Skills  Participation Level:  Did Not Attend   Description of Group:   In this group, patients learned how to recognize the physical, cognitive, emotional, and behavioral responses they have to anger-provoking situations.  They identified how they usually or often react when angered, and learned how healthy and unhealthy coping skills work initially, but the unhealthy ones stop working.   They analyzed how their frequently-chosen coping skill is possibly beneficial and how it is possibly unhelpful.  The group discussed a variety of healthier coping skills that could help in resolving the actual issues, as well as how to go about planning for the the possibility of future similar situations.  Therapeutic Goals: 1. Patients will identify one thing that makes them angry and how they feel emotionally and physically, what their thoughts are or tend to be in those situations, and what healthy or unhealthy coping mechanism they typically use 2. Patients will identify how their coping technique works for them, as well as how it works against them. 3. Patients will explore possible new behaviors to use in future anger situations. 4. Patients will learn that anger itself is normal and cannot be eliminated, and that healthier coping skills can assist with resolving conflict rather than worsening situations.  Summary of Patient Progress:  N/A  Therapeutic Modalities:   Cognitive Behavioral Therapy Motivation Interviewing  Yvonne Petite J Grossman-Orr  .  

## 2017-08-28 NOTE — Progress Notes (Addendum)
[facility] MD Progress Note  08/28/2017 11:15 AM Terrence Harris  MRN:  [id number] Subjective: Im kind of tired I guess.   Objective: Patient was admitted to the unit after being transferred from  [facility] ED 3 days ago status post overdose  on prescribed Wellbutrin XL ( about 7 tablets ), Lexapro (about 7 tablets as well).  Patient denies that this was an SA and reports that he became overwhelmed and acted off poor impulses.  As previously noted patient is observed laying in bed despite late morning, he states "I got up and went to breakfast and came back and lay back down ."  During today's evaluation patient is ruminating about discharge, however continues to exhibit multiple depressive symptoms.  Patient states he needs to know about his discharge as he has a court case on Wednesday for custody, and losing his kids is 1 of his triggers for his depression and suicidal thoughts.  At this time patient is minimizing symptoms of depression, despite evidence of isolation, withdrawn, anhedonia, and depressed mood.  Upon chart review patient has not attended group.  We were able to establish a goal today of staying out of the bed, and attending the rest of group sessions as noted he has missed 2 groups this morning. Patient is advised that group participation is a vital part of inpatient treatment, and would suggest he began to participate.  Patient is observed and hearing the unit rules and goal, and is observed in group this afternoon.  Patient inquires about his Vyvanse medication, and why was it not started back in the hospital.  Upon chart review there is no evidence of why medication was not resumed discussed with patient the importance of safety and the need to continue taking med medication as directed upon return home however it will not be administered and patient.  He states he uses this medication as part of his depression treatment to help make him more alert and active.  "This is why I am sleeping all day  because I am not getting my medicine "he remains on mirtazapine 7.5 mg p.o. nightly and Effexor 37.5 mg p.o. daily with breakfast, tolerating both medications well will increase at this time.  He denies any suicidal ideations, homicidal ideations, and or hallucinations.  He is able to contract for safety while on the unit.   Principal Problem: <principal problem not specified> Diagnosis:   Patient Active Problem List   Diagnosis Date Noted  . MDD (major depressive disorder), recurrent severe, without psychosis ([place]) [F33.2] 08/26/2017  . MDD (major depressive disorder), severe ([place]) [F32.2] 08/25/2017   Total Time spent with patient: 20 minutes  Past Psychiatric History: Depression, questionable Borderline Personality disorder, Alcohol abuse. one prior psychiatric admission 2 years ago at [facility] for alcohol abuse and suicidal ideations. Prior to admission he was being prescribed Wellbutrin XL, Lexapro, Neurontin, Vyvanse.  Past Medical History:  Past Medical History:  Diagnosis Date  . Anxiety   . Depression    History reviewed. No pertinent surgical history. Family History: History reviewed. No pertinent family history. Family Psychiatric  History: Maternal side mental health issues and alcoholism    Social History:  Social History   Substance and Sexual Activity  Alcohol Use Yes     Social History   Substance and Sexual Activity  Drug Use Yes  . Types: Cocaine    Social History   Socioeconomic History  . Marital status: Single    Spouse name: Not on file  .  Number of children: Not on file  . Years of education: Not on file  . Highest education level: Not on file  Occupational History  . Not on file  Social Needs  . Financial resource strain: Not on file  . Food insecurity:    Worry: Not on file    Inability: Not on file  . Transportation needs:    Medical: Not on file    Non-medical: Not on file  Tobacco Use  . Smoking status: Current Every Day Smoker     Types: E-cigarettes  . Smokeless tobacco: Never Used  Substance and Sexual Activity  . Alcohol use: Yes  . Drug use: Yes    Types: Cocaine  . Sexual activity: Not Currently  Lifestyle  . Physical activity:    Days per week: Not on file    Minutes per session: Not on file  . Stress: Not on file  Relationships  . Social connections:    Talks on phone: Not on file    Gets together: Not on file    Attends religious service: Not on file    Active member of club or organization: Not on file    Attends meetings of clubs or organizations: Not on file    Relationship status: Not on file  Other Topics Concern  . Not on file  Social History Narrative  . Not on file   Additional Social History:    Pain Medications: please see mar Prescriptions: please see mar Over the Counter: please see mar History of alcohol / drug use?: Yes Longest period of sobriety (when/how long): unknown Name of Substance 1: cocaine 1 - Age of First Use: unknown 1 - Amount (size/oz): unknown 1 - Frequency: unknown 1 - Duration: unknown 1 - Last Use / Amount: unknown   Sleep: Fair  Appetite:  Fair  Current Medications: Current Facility-Administered Medications  Medication Dose Route Frequency Provider Last Rate Last Dose  . hydrOXYzine (ATARAX/VISTARIL) tablet 25 mg  25 mg Oral TID PRN Rozetta Nunnery, NP   25 mg at 08/27/17 2112  . mirtazapine (REMERON) tablet 15 mg  15 mg Oral QHS Starkes, Takia S, FNP      . nicotine (NICODERM CQ - dosed in mg/24 hours) patch 14 mg  14 mg Transdermal Daily Cobos, Myer Peer, MD   14 mg at 08/28/17 0749  . [START ON 08/29/2017] venlafaxine XR (EFFEXOR-XR) 24 hr capsule 75 mg  75 mg Oral Q breakfast Nanci Pina, FNP        Lab Results:  Results for orders placed or performed during the hospital encounter of 08/25/17 (from the past 48 hour(s))  Urinalysis, Complete w Microscopic     Status: None   Collection Time: 08/26/17  5:25 PM  Result Value Ref Range    Color, Urine YELLOW YELLOW   APPearance CLEAR CLEAR   Specific Gravity, Urine 1.021 1.005 - 1.030   pH 5.0 5.0 - 8.0   Glucose, UA NEGATIVE NEGATIVE mg/dL   Hgb urine dipstick NEGATIVE NEGATIVE   Bilirubin Urine NEGATIVE NEGATIVE   Ketones, ur NEGATIVE NEGATIVE mg/dL   Protein, ur NEGATIVE NEGATIVE mg/dL   Nitrite NEGATIVE NEGATIVE   Leukocytes, UA NEGATIVE NEGATIVE   WBC, UA 0-5 0 - 5 WBC/hpf   Bacteria, UA NONE SEEN NONE SEEN   Squamous Epithelial / LPF 0-5 0 - 5   Mucus PRESENT     Comment: Performed at [facility], [place] [address], [place], [place] [address]  Urine rapid drug screen (hosp performed)     Status: Abnormal   Collection Time: 08/26/17  5:25 PM  Result Value Ref Range   Opiates NONE DETECTED NONE DETECTED   Cocaine NONE DETECTED NONE DETECTED   Benzodiazepines NONE DETECTED NONE DETECTED   Amphetamines NONE DETECTED NONE DETECTED   Tetrahydrocannabinol NONE DETECTED NONE DETECTED   Barbiturates (A) NONE DETECTED    Result not available. Reagent lot number recalled by manufacturer.    Comment: Performed at [facility], [place] [address] [place], [place] [address]  Basic metabolic panel     Status: None   Collection Time: 08/27/17  6:35 AM  Result Value Ref Range   Sodium 141 135 - 145 mmol/L   Potassium 4.0 3.5 - 5.1 mmol/L   Chloride 108 98 - 111 mmol/L    Comment: Please note change in reference range.   CO2 26 22 - 32 mmol/L   Glucose, Bld 99 70 - 99 mg/dL    Comment: Please note change in reference range.   BUN 11 6 - 20 mg/dL    Comment: Please note change in reference range.   Creatinine, Ser 0.99 0.61 - 1.24 mg/dL   Calcium 8.9 8.9 - 10.3 mg/dL   GFR calc non Af Amer >60 >60 mL/min   GFR calc Af Amer >60 >60 mL/min    Comment: (NOTE) The eGFR has been calculated using the CKD EPI equation. This calculation has not been validated in all clinical situations. eGFR's persistently <60 mL/min signify possible Chronic  Kidney Disease.    Anion gap 7 5 - 15    Comment: Performed at [facility], [place] [address], [place], [place] [address]  CBC with Differential/Platelet     Status: None   Collection Time: 08/27/17  6:35 AM  Result Value Ref Range   WBC 7.9 4.0 - 10.5 K/uL   RBC 5.19 4.22 - 5.81 MIL/uL   Hemoglobin 14.9 13.0 - 17.0 g/dL   HCT 44.2 39.0 - 52.0 %   MCV 85.2 78.0 - 100.0 fL   MCH 28.7 26.0 - 34.0 pg   MCHC 33.7 30.0 - 36.0 g/dL   RDW 14.3 11.5 - 15.5 %   Platelets 304 150 - 400 K/uL   Neutrophils Relative % 60 %   Neutro Abs 4.8 1.7 - 7.7 K/uL   Lymphocytes Relative 28 %   Lymphs Abs 2.2 0.7 - 4.0 K/uL   Monocytes Relative 8 %   Monocytes Absolute 0.6 0.1 - 1.0 K/uL   Eosinophils Relative 3 %   Eosinophils Absolute 0.2 0.0 - 0.7 K/uL   Basophils Relative 1 %   Basophils Absolute 0.0 0.0 - 0.1 K/uL    Comment: Performed at [facility], [place] [address], [place], [place] [address]  TSH     Status: None   Collection Time: 08/27/17  6:35 AM  Result Value Ref Range   TSH 1.231 0.350 - 4.500 uIU/mL    Comment: Performed by a 3rd Generation assay with a functional sensitivity of <=0.01 uIU/mL. Performed at [facility], [place] [address], [place], [place] [address]   Lipid panel     Status: Abnormal   Collection Time: 08/27/17  6:35 AM  Result Value Ref Range   Cholesterol 221 (H) 0 - 200 mg/dL   Triglycerides 228 (H) <150 mg/dL   HDL 43 >40 mg/dL   Total CHOL/HDL Ratio 5.1 RATIO   VLDL 46 (H) 0 -  40 mg/dL   LDL Cholesterol 132 (H) 0 - 99 mg/dL    Comment:        Total Cholesterol/HDL:CHD Risk Coronary Heart Disease Risk Table                     Men   Women  1/2 Average Risk   3.4   3.3  Average Risk       5.0   4.4  2 X Average Risk   9.6   7.1  3 X Average Risk  23.4   11.0        Use the calculated Patient Ratio above and the CHD Risk Table to determine the patient's CHD Risk.        ATP III CLASSIFICATION (LDL):   <100     mg/dL   Optimal  100-129  mg/dL   Near or Above                    Optimal  130-159  mg/dL   Borderline  160-189  mg/dL   High  >190     mg/dL   Very High Performed at [place] [address] [place], [place] [address]   Hemoglobin A1c     Status: None   Collection Time: 08/27/17  6:35 AM  Result Value Ref Range   Hgb A1c MFr Bld 5.1 4.8 - 5.6 %    Comment: (NOTE) Pre diabetes:          5.7%-6.4% Diabetes:              >6.4% Glycemic control for   <7.0% adults with diabetes    Mean Plasma Glucose 99.67 mg/dL    Comment: Performed at [place] [address], [place], [place] [address]  Salicylate level     Status: None   Collection Time: 08/27/17  6:35 AM  Result Value Ref Range   Salicylate Lvl <2.9 2.8 - 30.0 mg/dL    Comment: Performed at [facility], [place] [address], [place], [place] [address]  Acetaminophen level     Status: Abnormal   Collection Time: 08/27/17  6:35 AM  Result Value Ref Range   Acetaminophen (Tylenol), Serum <10 (L) 10 - 30 ug/mL    Comment: (NOTE) Therapeutic concentrations vary significantly. A range of 10-30 ug/mL  may be an effective concentration for many patients. However, some  are best treated at concentrations outside of this range. Acetaminophen concentrations >150 ug/mL at 4 hours after ingestion  and >50 ug/mL at 12 hours after ingestion are often associated with  toxic reactions. Performed at [facility], [place] [address], [place], [place] [address]     Blood Alcohol level:  No results found for: [facility]  Metabolic Disorder Labs: Lab Results  Component Value Date   HGBA1C 5.1 08/27/2017   MPG 99.67 08/27/2017   No results found for: PROLACTIN Lab Results  Component Value Date   CHOL 221 (H) 08/27/2017   TRIG 228 (H) 08/27/2017   HDL 43 08/27/2017   CHOLHDL 5.1 08/27/2017   VLDL 46 (H) 08/27/2017   LDLCALC 132 (H) 08/27/2017    Physical  Findings: AIMS: Facial and Oral Movements Muscles of Facial Expression: None, normal Lips and Perioral Area: None, normal Jaw: None, normal Tongue: None, normal,Extremity Movements Upper (arms, wrists, hands, fingers): None, normal Lower (legs, knees, ankles, toes): None, normal, Trunk Movements Neck, shoulders, hips: None, normal, Overall Severity Severity  of abnormal movements (highest score from questions above): None, normal Incapacitation due to abnormal movements: None, normal Patient's awareness of abnormal movements (rate only patient's report): No Awareness, Dental Status Current problems with teeth and/or dentures?: No Does patient usually wear dentures?: No   Musculoskeletal: Strength & Muscle Tone: within normal limits Gait & Station: normal Patient leans: N/A  Psychiatric Specialty Exam: Physical Exam   ROS   Blood pressure 104/76, pulse 83, temperature 99.5 F (37.5 C), temperature source Oral, resp. rate 20, height '5\' 10"'  (1.778 m), weight 118.8 kg (262 lb).Body mass index is 37.59 kg/m.  General Appearance: Disheveled and obese, paper scrubs and facial peircings.   Eye Contact:  Minimal  Speech:  Clear and Coherent and Normal Rate  Volume:  Normal  Mood:  Depressed  Affect:  Constricted and Depressed  Thought Process:  Linear and Descriptions of Associations: Intact  Orientation:  Full (Time, Place, and Person)  Thought Content:  Logical  Suicidal Thoughts:  No  Homicidal Thoughts:  No  Memory:  Immediate;   Fair Recent;   Fair  Judgement:  Poor  Insight:  Shallow  Psychomotor Activity:  Psychomotor Retardation  Concentration:  Concentration: Fair and Attention Span: Fair  Recall:  Poor  Fund of Knowledge:  Fair  Language:  Fair  Akathisia:  No  Handed:  Right  AIMS (if indicated):     Assets:  Communication Skills Desire for Improvement Financial Resources/Insurance Leisure Time Physical Health Transportation Vocational/Educational  ADL's:   Intact  Cognition:  WNL  Sleep:  Number of Hours: 6.75     Treatment Plan Summary: Daily contact with patient to assess and evaluate symptoms and progress in treatment and Medication management 1. Admit for crisis management and stabilization, estimated length of stay 3-5 days.  2. Medication management to reduce current symptoms to base line and improve the patient's overall level of functioning: We discussed options, agrees to new antidepressant medication trial.  Increase Effexor XR 75 mg p.o. daily and increase mirtazapine 15 mg p.o. nightly .  Vistaril 25 mg po Q6hrs as needed for anxiety, Nicoderm TD patch 14 mg daily for smoking cessation. 3. Treat health problems as indicated.  4. Develop treatment plan to decrease risk of relapse upon discharge and the need for readmission.  5. Psycho-social education regarding relapse prevention and self care.  6. Health care follow up as needed for medical problems.  7. Review, reconcile, and reinstate any pertinent home medications for other health issues where appropriate. 8. Call for consults with hospitalist for any additional specialty patient care services as needed. 9. Labs: TSH, CBC with diff, BMP and UA active. Ordered TSH, UDS, ethanol, salycialte  acetaminophen and HgbA1c. Li[pid panel elevated with lDL 132 and total cholesterol greater than 200.     Nanci Pina, FNP 08/28/2017, 11:15 AM    .Agree with NP Progress Note

## 2017-08-28 NOTE — [facility] Suicide Risk Assessment (Signed)
[facility] INPATIENT:  Family/Significant Other Suicide Prevention Education  Suicide Prevention Education:  Contact Attempts: Cecille Poammy Landau, Mother and Merita NortonLonnie Atkins, Roommate (name of family member/significant other) has been identified by the patient as the family member/significant other with whom the patient will be residing, and identified as the person(s) who will aid the patient in the event of a mental health crisis.  With written consent from the patient, two attempts were made to provide suicide prevention education, prior to and/or following the patient's discharge.  We were unsuccessful in providing suicide prevention education.  A suicide education pamphlet was given to the patient to share with family/significant other.  Date and time of first attempt: CSW attempted to reach mother on 6/29 at 4:08 pm. CSW attempted to reach roommate on 6/29 at 4:10pm. Date and time of second attempt: To be done at a later date and time  Shellia CleverlyStephanie N Remer Couse 08/28/2017, 4:11 PM

## 2017-08-28 NOTE — Progress Notes (Signed)
D: Patient alert and oriented. Affect/mood: Flat in affect, depressed in mood. Denies SI, HI, AVH at this time. Denies pain. Patient has not filled out patient self inventory sheet this morning, though has been observed in the dayroom at some times throughout the day, minimal interaction with peers noted. Patient is also minimal during 1:1 interactions, though it is reported that he contributed to yesterdays group discussion. Attempted to discuss patient stressors and anything progress/coping learned while inpatient on the unit, though patient was not forthcoming despite encouragement. Will continue to monitor for increased engagement on the unit.   A: Scheduled medications administered to patient per MD order. Support and encouragement provided. Routine safety checks conducted every 15 minutes. Patient informed to notify staff with problems or concerns. Encouraged to notify staff if feelings of harm toward self or others arise. Patient agrees.   R: No adverse drug reactions noted. Patient contracts for safety at this time. Maintains that he can remain safe on the unit. Patient compliant with medications and treatment plan, though remains minimal during interaction.at this time. Will continue to monitor.

## 2017-08-28 NOTE — [facility] Suicide Risk Assessment (Addendum)
[facility] INPATIENT:  Family/Significant Other Suicide Prevention Education  Mother reports she is putting on speaker to take notes, that she does not know about suicide prevention and that the ex-wife is the root of this hospitalization. Mother repots the ex wife will harass him and seems to get off on his grief. Mother reports he has all of the risk factors and a lot of things going on his life currently. Mother reports his medications are a big concern, he took too much and says it wasn't an attempt to kill himself but he will have to have his medications and could do it again. Mom reported he has a DWI, he is loosing his custody for his kids (not his rights yet) and he is irresponsible especially with driving. Mom reports he is not close, he is like an hour away so its not like she can go over there in a crisis. Mom stated she is sure at court on the 3rd, his custody will be taken away and feels it is best if he is not there. Mother reports he needs to mourn the loss of his kids. Mother stated last time a similar thing happened she wanted him to go to a thirty day program and she likes the one in [place]. Mom stated she is unsure if his insurance will cover it but that he needs to go away and have images of his brain taken, be [place]ized and reset himself. Mom asked for crisis information and wrote it down. CSW provided it as well as the contact number for his assigned Child psychotherapistsocial worker.   Suicide Prevention Education:  Education Completed; Tammy Gilberg, mother,  (name of family member/significant other) has been identified by the patient as the family member/significant other with whom the patient will be residing, and identified as the person(s) who will aid the patient in the event of a mental health crisis (suicidal ideations/suicide attempt).  With written consent from the patient, the family member/significant other has been provided the following suicide prevention education, prior to the and/or following the  discharge of the patient.  The suicide prevention education provided includes the following:  Suicide risk factors  Suicide prevention and interventions  National Suicide Hotline telephone number  [facility] assessment telephone number  [facility] Emergency Assistance 911  [facility] and/or Residential Mobile Crisis Unit telephone number  Request made of family/significant other to:  Remove weapons (e.g., guns, rifles, knives), all items previously/currently identified as safety concern.    Remove drugs/medications (over-the-counter, prescriptions, illicit drugs), all items previously/currently identified as a safety concern.  The family member/significant other verbalizes understanding of the suicide prevention education information provided.   Shellia CleverlyStephanie N Melvern Ramone 08/28/2017, 4:44 PM

## 2017-08-28 NOTE — Progress Notes (Signed)
D   Pt is pleasant on approach and cooperative   His behavior is appropriate and he interacts appropriately but minimally with others    A    Verbal support given  Medications administered and effectiveness monitored    Q 15 min checks R   Pt is safe at present

## 2017-08-29 NOTE — [facility] Group Notes (Signed)
[facility] Group Notes:  (Nursing/MHT/Case Management/Adjunct)  Date:  08/29/2017  Time:  2:07 PM  Type of Therapy:  Psychoeducational Skills  Participation Level:  Active  Participation Quality:  Appropriate  Affect:  Appropriate  Cognitive:  Appropriate  Insight:  Appropriate  Engagement in Group:  Engaged  Modes of Intervention:  Problem-solving  Summary of Progress/Problems: Pt attended Psychoeducational group with top topic healthy support systems.   Jheremy Boger Shanta 08/29/2017, 2:07 PM 

## 2017-08-29 NOTE — [facility] Group Notes (Signed)
[facility] Group Notes: (Clinical Social Work)   08/29/2017      Type of Therapy:  Group Therapy   Participation Level:  Did Not Attend despite MHT prompting   Makenize Messman Grossman-Orr, LCSW 08/29/2017, 10:10 AM     

## 2017-08-29 NOTE — Progress Notes (Signed)
Adult Psychoeducational Group Note  Date:  08/29/2017 Time:  9:50 PM  Group Topic/Focus:  Wrap-Up Group:   The focus of this group is to help patients review their daily goal of treatment and discuss progress on daily workbooks.  Participation Level:  Did Not Attend  Participation Quality:    Affect:    Cognitive:  Insight: Engagement in Group:    Modes of Intervention:  Support  Additional Comments:  Patient invited to participate in AA group but declined to attend.   Elky Funches 08/29/2017, 9:50 PM

## 2017-08-29 NOTE — Plan of Care (Signed)
  Problem: Education: Goal: Emotional status will improve Outcome: Progressing   

## 2017-08-29 NOTE — Progress Notes (Addendum)
[facility] MD Progress Note  08/29/2017 2:28 PM Terrence Harris  MRN:  [id number] Subjective: I feel much better them than when I came in. To only worry about things that I can control.  Objective: Patient was admitted to the unit after being transferred from  [facility] ED 3 days ago status post overdose  on prescribed Wellbutrin XL ( about 7 tablets ), Lexapro (about 7 tablets as well).  Patient denies that this was an SA and reports that he became overwhelmed and acted off poor impulses.  Today patient is observed interacting on the unit, and participating in group.  Patient is euthymic and brightens upon approach.  He is congratulated for being up and active, and participating in his treatment plan and adhering to the staff's recommendations of attending all groups.  Patient is also able to show insight and has a positive reflection of his impulsivity.  He states his goal for today is to prepare for discharge, and to stay positive.   "I have to forgive myself for the past.  That is something that I did not do before that led to me attempting suicide "upon discharge patient plans to return to his current living situation at home, he states he has a roommate.  At this time he denies any withdrawal symptoms, urges, cravings, depressive symptoms, and or anxiety.  He rates both his anxiety and depression 0 out of 10 with 10 being the worst.  He is tolerating his medication increase well at this time.  He is currently on Effexor 75 mg p.o. daily, and mirtazapine 15 mg p.o. nightly.  He denies any suicidal ideations, homicidal ideations, and or hallucinations.  He is able to contract for safety while on the unit.   Principal Problem: <principal problem not specified> Diagnosis:   Patient Active Problem List   Diagnosis Date Noted  . MDD (major depressive disorder), recurrent severe, without psychosis (HCC) [F33.2] 08/26/2017  . MDD (major depressive disorder), severe (HCC) [F32.2] 08/25/2017   Total Time spent  with patient: 20 minutes  Past Psychiatric History: Depression, questionable Borderline Personality disorder, Alcohol abuse. one prior psychiatric admission 2 years ago at [facility] for alcohol abuse and suicidal ideations. Prior to admission he was being prescribed Wellbutrin XL, Lexapro, Neurontin, Vyvanse.  Past Medical History:  Past Medical History:  Diagnosis Date  . Anxiety   . Depression    History reviewed. No pertinent surgical history. Family History: History reviewed. No pertinent family history. Family Psychiatric  History: Maternal side mental health issues and alcoholism    Social History:  Social History   Substance and Sexual Activity  Alcohol Use Yes     Social History   Substance and Sexual Activity  Drug Use Yes  . Types: Cocaine    Social History   Socioeconomic History  . Marital status: Single    Spouse name: Not on file  . Number of children: Not on file  . Years of education: Not on file  . Highest education level: Not on file  Occupational History  . Not on file  Social Needs  . Financial resource strain: Not on file  . Food insecurity:    Worry: Not on file    Inability: Not on file  . Transportation needs:    Medical: Not on file    Non-medical: Not on file  Tobacco Use  . Smoking status: Current Every Day Smoker    Types: E-cigarettes  . Smokeless tobacco: Never Used  Substance and Sexual Activity  .  Alcohol use: Yes  . Drug use: Yes    Types: Cocaine  . Sexual activity: Not Currently  Lifestyle  . Physical activity:    Days per week: Not on file    Minutes per session: Not on file  . Stress: Not on file  Relationships  . Social connections:    Talks on phone: Not on file    Gets together: Not on file    Attends religious service: Not on file    Active member of club or organization: Not on file    Attends meetings of clubs or organizations: Not on file    Relationship status: Not on file  Other Topics Concern  . Not on  file  Social History Narrative  . Not on file   Additional Social History:    Pain Medications: please see mar Prescriptions: please see mar Over the Counter: please see mar History of alcohol / drug use?: Yes Longest period of sobriety (when/how long): unknown Name of Substance 1: cocaine 1 - Age of First Use: unknown 1 - Amount (size/oz): unknown 1 - Frequency: unknown 1 - Duration: unknown 1 - Last Use / Amount: unknown   Sleep: Fair  Appetite:  Fair  Current Medications: Current Facility-Administered Medications  Medication Dose Route Frequency Provider Last Rate Last Dose  . hydrOXYzine (ATARAX/VISTARIL) tablet 25 mg  25 mg Oral TID PRN Jackelyn Poling, NP   25 mg at 08/27/17 2112  . mirtazapine (REMERON) tablet 15 mg  15 mg Oral QHS Truman Hayward, FNP   15 mg at 08/28/17 2101  . nicotine (NICODERM CQ - dosed in mg/24 hours) patch 14 mg  14 mg Transdermal Daily Cobos, Rockey Situ, MD   14 mg at 08/29/17 1155  . venlafaxine XR (EFFEXOR-XR) 24 hr capsule 75 mg  75 mg Oral Q breakfast Truman Hayward, FNP   75 mg at 08/29/17 1156    Lab Results:  No results found for this or any previous visit (from the past 48 hour(s)).  Blood Alcohol level:  No results found for: [facility]  Metabolic Disorder Labs: Lab Results  Component Value Date   HGBA1C 5.1 08/27/2017   MPG 99.67 08/27/2017   No results found for: PROLACTIN Lab Results  Component Value Date   CHOL 221 (H) 08/27/2017   TRIG 228 (H) 08/27/2017   HDL 43 08/27/2017   CHOLHDL 5.1 08/27/2017   VLDL 46 (H) 08/27/2017   LDLCALC 132 (H) 08/27/2017    Physical Findings: AIMS: Facial and Oral Movements Muscles of Facial Expression: None, normal Lips and Perioral Area: None, normal Jaw: None, normal Tongue: None, normal,Extremity Movements Upper (arms, wrists, hands, fingers): None, normal Lower (legs, knees, ankles, toes): None, normal, Trunk Movements Neck, shoulders, hips: None, normal, Overall  Severity Severity of abnormal movements (highest score from questions above): None, normal Incapacitation due to abnormal movements: None, normal Patient's awareness of abnormal movements (rate only patient's report): No Awareness, Dental Status Current problems with teeth and/or dentures?: No Does patient usually wear dentures?: No   Musculoskeletal: Strength & Muscle Tone: within normal limits Gait & Station: normal Patient leans: N/A  Psychiatric Specialty Exam: Physical Exam   ROS   Blood pressure 125/79, pulse 76, temperature 98 F (36.7 C), resp. rate 20, height 5\' 10"  (1.778 m), weight 118.8 kg (262 lb).Body mass index is 37.59 kg/m.  General Appearance: Fairly Groomed and obese, dressed appropriately and regular Mens clothes  Eye Contact:  Fair  Speech:  Clear  and Coherent and Normal Rate  Volume:  Normal  Mood:  Euthymic  Affect:  Appropriate and Congruent  Thought Process:  Coherent, Goal Directed and Descriptions of Associations: Intact  Orientation:  Full (Time, Place, and Person)  Thought Content:  WDL  Suicidal Thoughts:  No  Homicidal Thoughts:  No  Memory:  Immediate;   Fair Recent;   Fair  Judgement:  Fair  Insight:  Fair  Psychomotor Activity:  Normal  Concentration:  Concentration: Fair and Attention Span: Fair  Recall:  [employer]  Fund of Knowledge:  Fair  Language:  Fair  Akathisia:  No  Handed:  Right  AIMS (if indicated):     Assets:  Communication Skills Desire for Improvement Financial Resources/Insurance Leisure Time Physical Health Transportation Vocational/Educational  ADL's:  Intact  Cognition:  WNL  Sleep:  Number of Hours: 6.75     Treatment Plan Summary: Daily contact with patient to assess and evaluate symptoms and progress in treatment and Medication management 1. Admit for crisis management and stabilization, estimated length of stay 3-5 days.  2. Medication management to reduce current symptoms to base line and improve the  patient's overall level of functioning: We discussed options, agrees to new antidepressant medication trial.  Continue Effexor XR 75 mg p.o. daily and continue mirtazapine 15 mg p.o. nightly .  Vistaril 25 mg po Q6hrs as needed for anxiety, Nicoderm TD patch 14 mg daily for smoking cessation. 3. Treat health problems as indicated.  4. Develop treatment plan to decrease risk of relapse upon discharge and the need for readmission.  5. Psycho-social education regarding relapse prevention and self care.  6. Health care follow up as needed for medical problems.  7. Review, reconcile, and reinstate any pertinent home medications for other health issues where appropriate. 8. Call for consults with hospitalist for any additional specialty patient care services as needed. 9. Labs: TSH, CBC with diff, BMP and UA active. Ordered TSH, UDS, ethanol, salycialte  acetaminophen and HgbA1c. Li[pid panel elevated with lDL 132 and total cholesterol greater than 200.     Truman Haywardakia S Starkes, FNP 08/29/2017, 2:28 PM  ..Marland Kitchen.Agree with NP Progress Note

## 2017-08-29 NOTE — Progress Notes (Signed)
D Pt is observed resting quietly in his bed this am. HE is quiet, blunted and depressed when he awakens a little while later and explains that he " needed to sleep in this morning".      A HE completed his daily assessment and on this he wrote he deneid SI today and he rated his depression, hopelessness and anxeity " 0/0/1", respectively. He does not engage in conversation with this Clinical research associatewriter.     R Safety is in place.

## 2017-08-29 NOTE — [facility] Suicide Risk Assessment (Addendum)
[facility] INPATIENT:  Family/Significant Other Suicide Prevention Education  Suicide Prevention Education:  Contact Attempts: Merita NortonLonnie Atkins, roommate, (name of family member/significant other) has been identified by the patient as the family member/significant other with whom the patient will be residing, and identified as the person(s) who will aid the patient in the event of a mental health crisis.  With written consent from the patient, two attempts were made to provide suicide prevention education, prior to and/or following the patient's discharge.  We were unsuccessful in providing suicide prevention education.  A suicide education pamphlet was given to the patient to share with family/significant other.  Date and time of first attempt: 6/29 at 4:10 pm  Date and time of second attempt: 6/30 at 2:58 pm - was sent to voicemail, voicemail box name was not Lonnie. CSW did speak with mother yesterday but was attempting to complete with roommate also at patient's request considering parents live about an hour away from patient.   Shellia CleverlyStephanie N Aristeo Hankerson 08/29/2017, 2:59 PM

## 2017-08-30 DIAGNOSIS — F332 Major depressive disorder, recurrent severe without psychotic features: Principal | ICD-10-CM

## 2017-08-30 MED ORDER — VENLAFAXINE HCL ER 75 MG PO CP24: 75.0000 mg | ORAL_CAPSULE | Freq: Every day | ORAL | 0 refills | Status: AC

## 2017-08-30 MED ORDER — NICOTINE 14 MG/24HR TD PT24: 14.0000 mg | MEDICATED_PATCH | Freq: Every day | TRANSDERMAL | 0 refills | Status: AC

## 2017-08-30 MED ORDER — MIRTAZAPINE 15 MG PO TABS: 15.0000 mg | ORAL_TABLET | Freq: Every day | ORAL | 0 refills | Status: AC

## 2017-08-30 MED ORDER — HYDROXYZINE HCL 25 MG PO TABS: 25.0000 mg | ORAL_TABLET | Freq: Three times a day (TID) | ORAL | 0 refills | Status: AC | PRN

## 2017-08-30 NOTE — [facility] Suicide Risk Assessment (Signed)
[facility]   Principal Problem: MDD (major depressive disorder), recurrent severe, without psychosis (HCC) Discharge Diagnoses:  Patient Active Problem List   Diagnosis Date Noted  . MDD (major depressive disorder), recurrent severe, without psychosis (HCC) [F33.2] 08/26/2017  . MDD (major depressive disorder), severe (HCC) [F32.2] 08/25/2017    Total Time spent with patient: 30 minutes  Musculoskeletal: Strength & Muscle Tone: within normal limits Gait & Station: normal Patient leans: N/A  Psychiatric Specialty Exam: Review of Systems  All other systems reviewed and are negative.   Blood pressure 125/90, pulse 82, temperature 98.2 F (36.8 C), temperature source Oral, resp. rate 20, height 5\' 10"  (1.778 m), weight 118.8 kg (262 lb).Body mass index is 37.59 kg/m.  General Appearance: Casual  Eye Contact::  Fair  Speech:  Normal Rate409  Volume:  Normal  Mood:  Euthymic  Affect:  Appropriate  Thought Process:  Coherent  Orientation:  Full (Time, Place, and Person)  Thought Content:  Logical  Suicidal Thoughts:  No  Homicidal Thoughts:  No  Memory:  Immediate;   Fair Recent;   Fair Remote;   Fair  Judgement:  Intact  Insight:  Fair  Psychomotor Activity:  Normal  Concentration:  Fair  Recall:  [employer]Fair  Fund of Knowledge:Fair  Language: Fair  Akathisia:  Negative  Handed:  Right  AIMS (if indicated):     Assets:  Desire for Improvement Housing Resilience Social Support  Sleep:  Number of Hours: 6.75  Cognition: WNL  ADL's:  Intact   Mental Status Per Nursing Assessment::   On Admission:  Suicidal ideation indicated by patient, Self-harm behaviors  Demographic Factors:  Male, Caucasian, Low socioeconomic status and Living alone  Loss Factors: NA  Historical Factors: Impulsivity  Risk Reduction Factors:   Positive social support  Continued Clinical Symptoms:  Alcohol/Substance Abuse/Dependencies  Cognitive Features That  Contribute To Risk:  None    Suicide Risk:  Minimal: No identifiable suicidal ideation.  Patients presenting with no risk factors but with morbid ruminations; may be classified as minimal risk based on the severity of the depressive symptoms  Follow-up Information    [facility]. Go on 08/31/2017.   Why:  Appointment for medication management services is Tuesday, 08/31/17 at 9:45am. Please be sure to bring your Photo ID, SSN, any insurance/income information and discharge paperwork.  Contact information: [address] [place] [address] [phone]        [facility] Follow up.   Specialty:  Psychology Why:  Appointment for therapy services Contact information: [address] [place] [address] [phone]           Plan Of Care/Follow-up recommendations:  Activity:  ad lib  Antonieta PertGreg Lawson Eliani Leclere, MD 08/30/2017, 8:06 AM

## 2017-08-30 NOTE — Progress Notes (Signed)
  [facility]  Will you be returning to the same living situation after discharge:  Yes,  patient reports he is returning to his apartment with his roommate At discharge, do you have transportation home?: Yes,  patient reports his friend Derryl HarborLonnie will pick him up at discharge Do you have the ability to pay for your medications: Yes,  insurance  Release of information consent forms completed and in the chart;  Patient's signature needed at discharge.  Patient to Follow up at: Follow-up Information    [facility] Go on 08[facility].   Why:  Appointment for medication management services is Tuesday, 08/31/17 at 9:45am. Please be sure to bring your Photo ID, SSN, any insurance/income information and discharge paperwork.  Contact information: [address] [place] [address] [phone]        [facility] Follow up.   Specialty:  Psychology Why:  CSW left voicemail regarding follow up therapy appointment. Please call for therapy service within 3-5 days of discharge. Be sure to bring any discharge paperwork from this hospitalization.CSW will call with appointment if call is returned.  Contact information: [address] [place] [address] [phone]           [facility] level [facility] care provider [facility]:yes  Safety Planning and Suicide Prevention discussed: Yes,  with the patient's mother  Have you used any form of tobacco in the last 30 days? (Cigarettes, Smokeless Tobacco, Cigars, and/or Pipes): Yes  Has patient been referred to the Quitline?: Patient refused referral  Patient has been referred for addiction treatment: Pt. refused referral  Maeola SarahJolan E Shalae Belmonte, LCSWA 08/30/2017, 11:53 AM
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
## 2017-08-30 NOTE — Progress Notes (Signed)
Recreation Therapy Notes  Date: 7.1.19 Time: 0930 Location: [address]  Group Topic: Stress Management  Goal Area(s) Addresses:  Patient will verbalize importance of using healthy stress management.  Patient will identify positive emotions associated with healthy stress management.   Intervention: Stress Management  Activity :  Progressive Muscle Relaxation.  LRT introduced the stress management technique of progressive muscle relaxation.  LRT read script to guide patients through tensing and relaxing each muscle individually.  Patients were to follow along as LRT lead them through the exercise.  Education:  Stress Management, Discharge Planning.   Education Outcome: Acknowledges edcuation/In group clarification offered/Needs additional education  Clinical Observations/Feedback: Pt did not attend group.    Caroll RancherMarjette Gelila Well, LRT/CTRS         Caroll RancherLindsay, Azlyn Wingler A 08/30/2017 11:46 AM

## 2017-08-30 NOTE — Progress Notes (Signed)
D.  Pt pleasant on approach, no complaints voiced.  Pt remained in bed during group.   Pt denies SI/HI/AVH at this time.  Pt was observed interacting appropriately with peers on the unit.  A.  Support and encouragement offered, medication given as ordered  R.  Pt remains safe on the unit, will continue to monitor.

## 2017-08-30 NOTE — B[facility] Group Notes (Signed)
Pt attended spiritual care group on grief and loss facilitated by chaplain Burnis KingfisherMatthew Karah Caruthers   Group opened with brief discussion and psycho-social ed around grief and loss in relationships and in relation to self - identifying life patterns, circumstances, changes that cause losses. Established group norm of speaking from own life experience. Group goal of establishing open and affirming space for members to share loss and experience with grief, normalize grief experience and provide psycho social education and grief support.    Terrence Harris was present throughout group.  Alert and oriented, he engaged in group conversation voluntarily.  Noted that he is working on finding acceptance and self-forgiveness.  Stated he knows things are not going to go back the way they were, but is hopeful to find himself in this new place in his life.  Resonated with another group member who noted he had used alcohol to cope with grief in moment, but is working on Producer, television/film/videolearning new strategies.   Described grief around loss of job, relationship, children.   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  Children currently reside with foster family.

## 2017-09-02 NOTE — Progress Notes (Signed)
Patient discharged to lobby where family was there to pick him up. Patient reviewed discharge paperwork and prescriptions with Clinical research associatewriter. Patient did not have any questions. Patient denied SI HI AVH. All belongings returned to patient. Patient thanked staff for everything.

## 2018-07-29 ENCOUNTER — Encounter: Payer: Self-pay | Admitting: Emergency Medicine

## 2018-07-29 ENCOUNTER — Emergency Department: Payer: BLUE CROSS/BLUE SHIELD

## 2018-07-29 ENCOUNTER — Emergency Department
Admission: EM | Admit: 2018-07-29 | Discharge: 2018-07-29 | Disposition: A | Discharge status: Home / Self Care | Payer: BLUE CROSS/BLUE SHIELD | Attending: Emergency Medicine | Admitting: Emergency Medicine

## 2018-07-29 ENCOUNTER — Other Ambulatory Visit: Payer: Self-pay

## 2018-07-29 DIAGNOSIS — Z79899 Other long term (current) drug therapy: Secondary | ICD-10-CM | POA: Diagnosis not present

## 2018-07-29 DIAGNOSIS — Y9389 Activity, other specified: Secondary | ICD-10-CM | POA: Diagnosis not present

## 2018-07-29 DIAGNOSIS — M25561 Pain in right knee: Secondary | ICD-10-CM | POA: Diagnosis not present

## 2018-07-29 DIAGNOSIS — Y999 Unspecified external cause status: Secondary | ICD-10-CM | POA: Diagnosis not present

## 2018-07-29 DIAGNOSIS — Y92410 Unspecified street and highway as the place of occurrence of the external cause: Secondary | ICD-10-CM | POA: Insufficient documentation

## 2018-07-29 DIAGNOSIS — W2211XA Striking against or struck by driver side automobile airbag, initial encounter: Secondary | ICD-10-CM | POA: Diagnosis not present

## 2018-07-29 DIAGNOSIS — S59912A Unspecified injury of left forearm, initial encounter: Secondary | ICD-10-CM | POA: Diagnosis present

## 2018-07-29 DIAGNOSIS — F17290 Nicotine dependence, other tobacco product, uncomplicated: Secondary | ICD-10-CM | POA: Diagnosis not present

## 2018-07-29 DIAGNOSIS — T22112A Burn of first degree of left forearm, initial encounter: Secondary | ICD-10-CM | POA: Diagnosis not present

## 2018-07-29 MED ORDER — LIDOCAINE VISCOUS HCL 2 % MT SOLN
15.0000 mL | Freq: Once | OROMUCOSAL | Status: AC
  Administered 2018-07-29: 03:00:00 15 mL via OROMUCOSAL
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
## 2018-07-29 NOTE — ED Notes (Signed)
Pt ambulatory with steady gait noted  
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
## 2018-07-29 NOTE — ED Notes (Signed)
Patient transported to X-ray 
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
